# Patient Record
Sex: Male | Born: 1960 | Race: Black or African American | Hispanic: No | Marital: Married | Smoking: Never smoker
Health system: Southern US, Community
[De-identification: ages and names within clinical notes are randomized; demographics above are authoritative.]

## PROBLEM LIST (undated history)

## (undated) DIAGNOSIS — C649 Malignant neoplasm of unspecified kidney, except renal pelvis: Secondary | ICD-10-CM

## (undated) DIAGNOSIS — E119 Type 2 diabetes mellitus without complications: Secondary | ICD-10-CM

## (undated) HISTORY — PX: NEPHRECTOMY: SHX65

---

## 2019-02-17 ENCOUNTER — Other Ambulatory Visit: Payer: Self-pay

## 2019-02-17 ENCOUNTER — Emergency Department (HOSPITAL_BASED_OUTPATIENT_CLINIC_OR_DEPARTMENT_OTHER): Payer: Medicaid Other

## 2019-02-17 ENCOUNTER — Emergency Department (HOSPITAL_BASED_OUTPATIENT_CLINIC_OR_DEPARTMENT_OTHER)
Admission: EM | Admit: 2019-02-17 | Discharge: 2019-02-17 | Disposition: A | Discharge status: Home / Self Care | Payer: Medicaid Other | Attending: Emergency Medicine | Admitting: Emergency Medicine

## 2019-02-17 ENCOUNTER — Encounter (HOSPITAL_BASED_OUTPATIENT_CLINIC_OR_DEPARTMENT_OTHER): Payer: Self-pay

## 2019-02-17 DIAGNOSIS — Z85528 Personal history of other malignant neoplasm of kidney: Secondary | ICD-10-CM | POA: Diagnosis not present

## 2019-02-17 DIAGNOSIS — R109 Unspecified abdominal pain: Secondary | ICD-10-CM

## 2019-02-17 DIAGNOSIS — R110 Nausea: Secondary | ICD-10-CM | POA: Insufficient documentation

## 2019-02-17 DIAGNOSIS — R1011 Right upper quadrant pain: Secondary | ICD-10-CM | POA: Diagnosis not present

## 2019-02-17 DIAGNOSIS — R519 Headache, unspecified: Secondary | ICD-10-CM | POA: Insufficient documentation

## 2019-02-17 DIAGNOSIS — E119 Type 2 diabetes mellitus without complications: Secondary | ICD-10-CM | POA: Insufficient documentation

## 2019-02-17 DIAGNOSIS — R1013 Epigastric pain: Secondary | ICD-10-CM | POA: Insufficient documentation

## 2019-02-17 HISTORY — DX: Type 2 diabetes mellitus without complications: E11.9

## 2019-02-17 HISTORY — DX: Malignant neoplasm of unspecified kidney, except renal pelvis: C64.9

## 2019-02-17 LAB — URINALYSIS, ROUTINE W REFLEX MICROSCOPIC
Bilirubin Urine: NEGATIVE
Glucose, UA: 100 mg/dL — AB
Hgb urine dipstick: NEGATIVE
Ketones, ur: NEGATIVE mg/dL
Leukocytes,Ua: NEGATIVE
Nitrite: NEGATIVE
Protein, ur: NEGATIVE mg/dL
Specific Gravity, Urine: 1.025 (ref 1.005–1.030)
pH: 6 (ref 5.0–8.0)

## 2019-02-17 LAB — CBC WITH DIFFERENTIAL/PLATELET
Abs Immature Granulocytes: 0.01 10*3/uL (ref 0.00–0.07)
Basophils Absolute: 0.1 10*3/uL (ref 0.0–0.1)
Basophils Relative: 1 %
Eosinophils Absolute: 0.6 10*3/uL — ABNORMAL HIGH (ref 0.0–0.5)
Eosinophils Relative: 6 %
HCT: 49.6 % (ref 39.0–52.0)
Hemoglobin: 16 g/dL (ref 13.0–17.0)
Immature Granulocytes: 0 %
Lymphocytes Relative: 27 %
Lymphs Abs: 2.5 10*3/uL (ref 0.7–4.0)
MCH: 28.9 pg (ref 26.0–34.0)
MCHC: 32.3 g/dL (ref 30.0–36.0)
MCV: 89.7 fL (ref 80.0–100.0)
Monocytes Absolute: 1 10*3/uL (ref 0.1–1.0)
Monocytes Relative: 10 %
Neutro Abs: 5.4 10*3/uL (ref 1.7–7.7)
Neutrophils Relative %: 56 %
Platelets: 221 10*3/uL (ref 150–400)
RBC: 5.53 MIL/uL (ref 4.22–5.81)
RDW: 13.7 % (ref 11.5–15.5)
WBC: 9.5 10*3/uL (ref 4.0–10.5)
nRBC: 0 % (ref 0.0–0.2)

## 2019-02-17 LAB — COMPREHENSIVE METABOLIC PANEL
ALT: 48 U/L — ABNORMAL HIGH (ref 0–44)
AST: 36 U/L (ref 15–41)
Albumin: 4.1 g/dL (ref 3.5–5.0)
Alkaline Phosphatase: 53 U/L (ref 38–126)
Anion gap: 11 (ref 5–15)
BUN: 18 mg/dL (ref 6–20)
CO2: 23 mmol/L (ref 22–32)
Calcium: 9.9 mg/dL (ref 8.9–10.3)
Chloride: 103 mmol/L (ref 98–111)
Creatinine, Ser: 1.45 mg/dL — ABNORMAL HIGH (ref 0.61–1.24)
GFR calc Af Amer: >60 mL/min (ref >60)
GFR calc non Af Amer: 53 mL/min — ABNORMAL LOW (ref >60)
Glucose, Bld: 123 mg/dL — ABNORMAL HIGH (ref 70–99)
Potassium: 4.7 mmol/L (ref 3.5–5.1)
Sodium: 137 mmol/L (ref 135–145)
Total Bilirubin: 0.8 mg/dL (ref 0.3–1.2)
Total Protein: 8 g/dL (ref 6.5–8.1)

## 2019-02-17 LAB — LIPASE, BLOOD: Lipase: 34 U/L (ref 11–51)

## 2019-02-17 MED ORDER — PROCHLORPERAZINE EDISYLATE 10 MG/2ML IJ SOLN
10.0000 mg | Freq: Once | INTRAMUSCULAR | Status: AC
  Administered 2019-02-17: 10 mg via INTRAVENOUS
  Filled 2019-02-17: qty 2

## 2019-02-17 MED ORDER — MORPHINE SULFATE (PF) 4 MG/ML IV SOLN
4.0000 mg | Freq: Once | INTRAVENOUS | Status: AC
  Administered 2019-02-17: 4 mg via INTRAVENOUS
  Filled 2019-02-17: qty 1

## 2019-02-17 MED ORDER — SODIUM CHLORIDE 0.9 % IV BOLUS
1000.0000 mL | Freq: Once | INTRAVENOUS | Status: AC
  Administered 2019-02-17: 1000 mL via INTRAVENOUS

## 2019-02-17 MED ORDER — KETOROLAC TROMETHAMINE 15 MG/ML IJ SOLN
15.0000 mg | Freq: Once | INTRAMUSCULAR | Status: AC
  Administered 2019-02-17: 15 mg via INTRAVENOUS
  Filled 2019-02-17: qty 1

## 2019-02-17 MED ORDER — DIPHENHYDRAMINE HCL 50 MG/ML IJ SOLN
12.5000 mg | Freq: Once | INTRAMUSCULAR | Status: AC
  Administered 2019-02-17: 12.5 mg via INTRAVENOUS
  Filled 2019-02-17: qty 1

## 2019-02-17 NOTE — ED Provider Notes (Addendum)
[facility] EMERGENCY DEPARTMENT Provider Note   CSN: [id number] Arrival date & time: 02/17/19  1538     History   Chief Complaint Chief Complaint  Patient presents with  . Mutiple c/o    HPI Leonard Wells is a 57 y.o. male.     HPI   Patient is a 58 year old male with a history of kidney cancer s/p right nephrectomy, diabetes, who presents to the emergency department today for evaluation of multiple complaints including headache, nausea, abd pain.   Headache: HA started gradually yesterday. HA located to the frontal aspect of the head. Pain feels like a throbbing pain that is rated 7/10. Denies exacerbating or alleviating factors. Denies vision changes, speech problems, ataxia, numbness/weakness, lightheadedness. He reports intermittent dizziness that occurs sporadically. Lasts for about 2-3 seconds and resolves on its own. He has taken oxycodone with mild relief.   Abd pain: Abd pain is diffuse and has been intermittent since yesterday. At it its worse he rates the pain 7/10. Describes it as an aching pain. He has never had similar abd pain in the past. Pain is worse when laying on his back or sides. He reports associated nausea, but denies vomiting, diarrhea, constipation. He denies urinary sxs. He is still passing flatus.   Denies rhinorrhea, congestion, sore throat, fever, neck stiffness, neck pain. Denies any chest pain or cough.   He states he called his doctor PTA and who told him to stop taking his medication, Inlyta, as it may be causing his sxs. He states he had chemo 4 days ago. He states that the last two times he had chemo he has felt similar sxs.   Daughter at bedside also voices concern that she states patient had an episode where he zoned out and did not listen to someone who was talking to him.  He later did not recall that this happened.  Another family were also noticed that he was shaking in his sleep at one point yesterday.  Patient notes he has tremors  in his hands when he is awake.  Reviewed records.  Patient had MRI lumbar spine 02/03/2019 which showed, "Lower lumbar spondylosis, slightly progressed from prior MRI 11-Jul-2010. 2. Findings most pronounced at L5-S1 where there is moderate right and mild-to-moderate left foraminal stenosis. 3. Mild-to-moderate bilateral foraminal stenosis at L4-5." He has also had a whole body bone scan on 12/20/18 which did not show any obvious update to suggest mets.   Past Medical History:  Diagnosis Date  . Cancer of kidney ([place])   . Diabetes mellitus without complication ([place])     There are no active problems to display for this patient.  Past Surgical History:  Procedure Laterality Date  . NEPHRECTOMY       Home Medications    Prior to Admission medications   Not on File    Family History No family history on file.  Social History Social History   Tobacco Use  . Smoking status: Never Smoker  . Smokeless tobacco: Never Used  Substance Use Topics  . Alcohol use: Never    Frequency: Never  . Drug use: Never     Allergies   Patient has no known allergies.   Review of Systems Review of Systems  Constitutional: Negative for fever.  HENT: Negative for ear pain and sore throat.   Eyes: Negative for visual disturbance.  Respiratory: Negative for cough and shortness of breath.   Cardiovascular: Negative for chest pain.  Gastrointestinal: Positive for abdominal pain and  nausea. Negative for constipation, diarrhea and vomiting.  Genitourinary: Negative for dysuria and hematuria.  Musculoskeletal: Negative for back pain.  Skin: Negative for rash.  Neurological: Positive for headaches.  All other systems reviewed and are negative.    Physical Exam Updated Vital Signs BP (!) 145/106 (BP Location: Right Arm)   Pulse 81   Temp 98.6 F (37 C) (Oral)   Resp 18   Ht 6\' 2"  (1.88 m)   Wt 123.4 kg   SpO2 98%   BMI 34.92 kg/m   Physical Exam Vitals signs and nursing note  reviewed.  Constitutional:      General: He is not in acute distress.    Appearance: He is well-developed. He is not ill-appearing.  HENT:     Head: Normocephalic and atraumatic.  Eyes:     Conjunctiva/sclera: Conjunctivae normal.  Neck:     Musculoskeletal: Neck supple.  Cardiovascular:     Rate and Rhythm: Normal rate and regular rhythm.     Pulses: Normal pulses.     Heart sounds: Normal heart sounds. No murmur.  Pulmonary:     Effort: Pulmonary effort is normal. No respiratory distress.     Breath sounds: Normal breath sounds. No wheezing, rhonchi or rales.  Abdominal:     General: Bowel sounds are normal.     Palpations: Abdomen is soft.     Tenderness: There is abdominal tenderness (mild epigastric and RUQ TTP). There is right CVA tenderness (mild). There is no left CVA tenderness, guarding or rebound.  Skin:    General: Skin is warm and dry.  Neurological:     Mental Status: He is alert.     ED Treatments / Results  Labs (all labs ordered are listed, but only abnormal results are displayed) Labs Reviewed  CBC WITH DIFFERENTIAL/PLATELET - Abnormal; Notable for the following components:      Result Value   Eosinophils Absolute 0.6 (*)    All other components within normal limits  COMPREHENSIVE METABOLIC PANEL - Abnormal; Notable for the following components:   Glucose, Bld 123 (*)    Creatinine, Ser 1.45 (*)    ALT 48 (*)    GFR calc non Af Amer 53 (*)    All other components within normal limits  URINALYSIS, ROUTINE W REFLEX MICROSCOPIC - Abnormal; Notable for the following components:   Glucose, UA 100 (*)    All other components within normal limits  LIPASE, BLOOD    EKG None  Radiology Ct Head Wo Contrast  Result Date: 02/17/2019 CLINICAL DATA:  Altered mental status EXAM: CT HEAD WITHOUT CONTRAST TECHNIQUE: Contiguous axial images were obtained from the base of the skull through the vertex without intravenous contrast. COMPARISON:  MRI brain 02/10/2018  FINDINGS: Brain: No acute territorial infarction, hemorrhage or intracranial mass. The ventricles are nonenlarged Vascular: No hyperdense vessel or unexpected calcification. Skull: Normal. Negative for fracture or focal lesion. Sinuses/Orbits: No acute finding. Other: None IMPRESSION: Negative non contrasted CT appearance of the brain Electronically Signed   By: Donavan Foil M.D.   On: 02/17/2019 20:05    Procedures Procedures (including critical care time)  Medications Ordered in ED Medications  sodium chloride 0.9 % bolus 1,000 mL (0 mLs Intravenous Stopped 02/17/19 1817)  prochlorperazine (COMPAZINE) injection 10 mg (10 mg Intravenous Given 02/17/19 1752)  diphenhydrAMINE (BENADRYL) injection 12.5 mg (12.5 mg Intravenous Given 02/17/19 1747)  ketorolac (TORADOL) 15 MG/ML injection 15 mg (15 mg Intravenous Given 02/17/19 1749)  morphine 4 MG/ML injection  4 mg (4 mg Intravenous Given 02/17/19 1858)     Initial Impression / Assessment and Plan / ED Course  I have reviewed the triage vital signs and the nursing notes.  Pertinent labs & imaging results that were available during my care of the patient were reviewed by me and considered in my medical decision making (see chart for details).   Final Clinical Impressions(s) / ED Diagnoses   Final diagnoses:  Nonintractable headache, unspecified chronicity pattern, unspecified headache type  Abdominal pain, unspecified abdominal location   Patient is a 58 year old male with a history of kidney cancer s/p right nephrectomy, diabetes, who presents to the emergency department today for headache, nausea and abd pain. He states he has had similar sxs in the past after receiving chemo.   Headache: Frontal HA started gradually yesterday. No associated neuro complaints and neuro exam is normal.   Abd pain: Abd pain is diffuse and has been intermittent since yesterday. On exam has mild epigastric and ruq ttp. No peritoneal signs.  He states he called  his doctor PTA and who told him to stop taking his medication, Inlyta, as it may be causing his sxs.   Pts exam is reassuring. Will obtain labs and give migraine cocktail and reassess.   Reviewed labs and imaging. CBC wnl.  CMP with slight elevation of AST.  Creatinine is at baseline.  His lecture lites are within normal limits.  Lipase is normal.  UA negative for UTI.  I completed a CT scan of his head without any acute findings.  On reassessment he states he feels improved after headache cocktail and pain medication and fluids.  I advised he follow-up with his oncologist in relation to his medication as he may be having symptoms secondary to this.  Advised return to the ED for new or worsening symptoms.  Patient and daughter at bedside voiced understanding and reasons to return.  All questions answered.  Patient is aware discharge.  Case was discussed with Dr. Melina Copa, attending physician who is in agreement with the workup and plan.   ED Discharge Orders    None       Rodney Booze, PA-C 02/17/19 2214    Rodney Booze, PA-C 02/17/19 2214    Hayden Rasmussen, MD 02/17/19 2215

## 2019-02-17 NOTE — ED Triage Notes (Signed)
Pt entered triage with slow gait-when asked what he was here for states "everything"-c/o back pain, abd pain, nausea, HA-sx started yesterday-NAD

## 2019-02-17 NOTE — ED Notes (Signed)
Call placed to daughter per patients request.  She went home, will come back to visit with patient.

## 2019-02-17 NOTE — Discharge Instructions (Signed)
Please call your oncologist tomorrow to schedule an appointment for follow-up and return to the emergency department for any new or worsening symptoms.

## 2019-02-23 ENCOUNTER — Encounter (HOSPITAL_BASED_OUTPATIENT_CLINIC_OR_DEPARTMENT_OTHER): Payer: Self-pay | Admitting: *Deleted

## 2019-02-23 ENCOUNTER — Emergency Department (HOSPITAL_BASED_OUTPATIENT_CLINIC_OR_DEPARTMENT_OTHER): Payer: Medicaid Other

## 2019-02-23 ENCOUNTER — Other Ambulatory Visit: Payer: Self-pay

## 2019-02-23 ENCOUNTER — Emergency Department (HOSPITAL_BASED_OUTPATIENT_CLINIC_OR_DEPARTMENT_OTHER)
Admission: EM | Admit: 2019-02-23 | Discharge: 2019-02-23 | Disposition: A | Discharge status: Home / Self Care | Payer: Medicaid Other | Attending: Emergency Medicine | Admitting: Emergency Medicine

## 2019-02-23 DIAGNOSIS — R112 Nausea with vomiting, unspecified: Secondary | ICD-10-CM | POA: Insufficient documentation

## 2019-02-23 DIAGNOSIS — E119 Type 2 diabetes mellitus without complications: Secondary | ICD-10-CM | POA: Diagnosis not present

## 2019-02-23 DIAGNOSIS — E860 Dehydration: Secondary | ICD-10-CM | POA: Insufficient documentation

## 2019-02-23 DIAGNOSIS — R1084 Generalized abdominal pain: Secondary | ICD-10-CM | POA: Insufficient documentation

## 2019-02-23 DIAGNOSIS — R109 Unspecified abdominal pain: Secondary | ICD-10-CM | POA: Diagnosis present

## 2019-02-23 LAB — URINALYSIS, ROUTINE W REFLEX MICROSCOPIC
Bilirubin Urine: NEGATIVE
Glucose, UA: NEGATIVE mg/dL
Hgb urine dipstick: NEGATIVE
Ketones, ur: NEGATIVE mg/dL
Leukocytes,Ua: NEGATIVE
Nitrite: NEGATIVE
Protein, ur: NEGATIVE mg/dL
Specific Gravity, Urine: 1.02 (ref 1.005–1.030)
pH: 6.5 (ref 5.0–8.0)

## 2019-02-23 LAB — CBC
HCT: 45.1 % (ref 39.0–52.0)
Hemoglobin: 14.6 g/dL (ref 13.0–17.0)
MCH: 29.3 pg (ref 26.0–34.0)
MCHC: 32.4 g/dL (ref 30.0–36.0)
MCV: 90.4 fL (ref 80.0–100.0)
Platelets: 237 10*3/uL (ref 150–400)
RBC: 4.99 MIL/uL (ref 4.22–5.81)
RDW: 14.2 % (ref 11.5–15.5)
WBC: 9.6 10*3/uL (ref 4.0–10.5)
nRBC: 0 % (ref 0.0–0.2)

## 2019-02-23 LAB — COMPREHENSIVE METABOLIC PANEL
ALT: 78 U/L — ABNORMAL HIGH (ref 0–44)
AST: 111 U/L — ABNORMAL HIGH (ref 15–41)
Albumin: 3.6 g/dL (ref 3.5–5.0)
Alkaline Phosphatase: 91 U/L (ref 38–126)
Anion gap: 10 (ref 5–15)
BUN: 21 mg/dL — ABNORMAL HIGH (ref 6–20)
CO2: 24 mmol/L (ref 22–32)
Calcium: 9.5 mg/dL (ref 8.9–10.3)
Chloride: 105 mmol/L (ref 98–111)
Creatinine, Ser: 1.58 mg/dL — ABNORMAL HIGH (ref 0.61–1.24)
GFR calc Af Amer: 55 mL/min — ABNORMAL LOW (ref >60)
GFR calc non Af Amer: 48 mL/min — ABNORMAL LOW (ref >60)
Glucose, Bld: 168 mg/dL — ABNORMAL HIGH (ref 70–99)
Potassium: 3.7 mmol/L (ref 3.5–5.1)
Sodium: 139 mmol/L (ref 135–145)
Total Bilirubin: 0.9 mg/dL (ref 0.3–1.2)
Total Protein: 7.4 g/dL (ref 6.5–8.1)

## 2019-02-23 LAB — LIPASE, BLOOD: Lipase: 94 U/L — ABNORMAL HIGH (ref 11–51)

## 2019-02-23 MED ORDER — ONDANSETRON HCL 4 MG/2ML IJ SOLN
4.0000 mg | Freq: Once | INTRAMUSCULAR | Status: AC
  Administered 2019-02-23: 05:00:00 4 mg via INTRAVENOUS
  Filled 2019-02-23: qty 2

## 2019-02-23 MED ORDER — SODIUM CHLORIDE 0.9% FLUSH
3.0000 mL | Freq: Once | INTRAVENOUS | Status: AC
  Administered 2019-02-23: 04:00:00 3 mL via INTRAVENOUS
  Filled 2019-02-23: qty 3

## 2019-02-23 MED ORDER — ONDANSETRON HCL 4 MG/2ML IJ SOLN
INTRAMUSCULAR | Status: AC
  Administered 2019-02-23: 04:00:00 4 mg via INTRAVENOUS
  Filled 2019-02-23: qty 2

## 2019-02-23 MED ORDER — IOHEXOL 300 MG/ML  SOLN
80.0000 mL | Freq: Once | INTRAMUSCULAR | Status: AC | PRN
  Administered 2019-02-23: 05:00:00 80 mL via INTRAVENOUS

## 2019-02-23 MED ORDER — SODIUM CHLORIDE 0.9 % IV BOLUS (SEPSIS)
1000.0000 mL | Freq: Once | INTRAVENOUS | Status: AC
  Administered 2019-02-23: 05:00:00 1000 mL via INTRAVENOUS

## 2019-02-23 MED ORDER — SODIUM CHLORIDE 0.9 % IV BOLUS (SEPSIS)
1000.0000 mL | Freq: Once | INTRAVENOUS | Status: AC
  Administered 2019-02-23: 04:00:00 1000 mL via INTRAVENOUS

## 2019-02-23 MED ORDER — FENTANYL CITRATE (PF) 100 MCG/2ML IJ SOLN
100.0000 ug | Freq: Once | INTRAMUSCULAR | Status: AC
  Administered 2019-02-23: 04:00:00 100 ug via INTRAVENOUS
  Filled 2019-02-23: qty 2

## 2019-02-23 MED ORDER — FENTANYL CITRATE (PF) 100 MCG/2ML IJ SOLN
100.0000 ug | Freq: Once | INTRAMUSCULAR | Status: AC
  Administered 2019-02-23: 06:00:00 100 ug via INTRAVENOUS
  Filled 2019-02-23: qty 2

## 2019-02-23 MED ORDER — ONDANSETRON HCL 4 MG/2ML IJ SOLN
4.0000 mg | Freq: Once | INTRAMUSCULAR | Status: AC | PRN
  Administered 2019-02-23: 04:00:00 4 mg via INTRAVENOUS

## 2019-02-23 NOTE — ED Notes (Signed)
Patient transported to CT 

## 2019-02-23 NOTE — ED Triage Notes (Signed)
Pt arrives via [facility] from home with c/o right sided abdominal pain. Pt has hx of kidney cancer. 148/96, hr78, r18, cbg 118, temp 97.9  Pt reporting onset of abdominal pain that awoke him from sleep. Pt actively vomiting during triage (500cc clear emesis). No fevers, diarrhea or constipation. No meds PTA.

## 2019-02-23 NOTE — ED Notes (Signed)
ED Provider at bedside. 

## 2019-02-23 NOTE — ED Provider Notes (Signed)
[facility] EMERGENCY DEPARTMENT Provider Note   CSN: [id number] Arrival date & time: 02/23/19  0330     History   Chief Complaint Chief Complaint  Patient presents with  . Abdominal Pain    HPI Leonard Wells is a 58 y.o. male.     The history is provided by the patient.  Abdominal Pain Pain location:  Generalized Pain quality: aching   Pain radiates to:  Does not radiate Pain severity:  Severe Onset quality:  Sudden Timing:  Constant Progression:  Worsening Chronicity:  Recurrent Relieved by:  Nothing Worsened by:  Movement and palpation Associated symptoms: nausea and vomiting   Associated symptoms: no chest pain, no constipation, no cough, no diarrhea, no dysuria, no fever, no hematemesis, no hematochezia and no shortness of breath   Risk factors: multiple surgeries   Patient with history of diabetes, renal carcinoma s/p right nephrectomy on chemotherapy presents with abdominal pain and vomiting.  He reports he woke up with the symptoms.  He went to bed feeling well.  No fevers.  No chest pain/shortness of breath/cough.  No recent change in his bowel habits.  He does report he is having symptoms previously, but this came on abruptly tonight.  Past Medical History:  Diagnosis Date  . Cancer of kidney ([place])   . Diabetes mellitus without complication ([place])     There are no active problems to display for this patient.   Past Surgical History:  Procedure Laterality Date  . NEPHRECTOMY          Home Medications    Prior to Admission medications   Not on File    Family History No family history on file.  Social History Social History   Tobacco Use  . Smoking status: Never Smoker  . Smokeless tobacco: Never Used  Substance Use Topics  . Alcohol use: Never    Frequency: Never  . Drug use: Never     Allergies   Patient has no known allergies.   Review of Systems Review of Systems  Constitutional: Negative for fever.  Respiratory:  Negative for cough and shortness of breath.   Cardiovascular: Negative for chest pain.  Gastrointestinal: Positive for abdominal pain, nausea and vomiting. Negative for constipation, diarrhea, hematemesis and hematochezia.  Genitourinary: Negative for dysuria.  All other systems reviewed and are negative.    Physical Exam Updated Vital Signs BP (!) 146/96   Pulse 83   Temp 97.9 F (36.6 C) (Oral)   Resp 18   SpO2 100%   Physical Exam CONSTITUTIONAL: Well developed/well nourished, uncomfortable. HEAD: Normocephalic/atraumatic EYES: EOMI/PERRL, no icterus ENMT: Mucous membranes dry NECK: supple no meningeal signs SPINE/BACK:entire spine nontender CV: S1/S2 noted, no murmurs/rubs/gallops noted LUNGS: Lungs are clear to auscultation bilaterally, no apparent distress ABDOMEN: soft, moderate diffuse tenderness, no rebound or guarding, decreased bowel sounds throughout abdomen, multiple surgical scars noted GU:no cva tenderness NEURO: Pt is awake/alert/appropriate, moves all extremitiesx4.  No facial droop.   EXTREMITIES: pulses normal/equal, full ROM SKIN: warm, color normal PSYCH: no abnormalities of mood noted, alert and oriented to situation   ED Treatments / Results  Labs (all labs ordered are listed, but only abnormal results are displayed) Labs Reviewed  LIPASE, BLOOD - Abnormal; Notable for the following components:      Result Value   Lipase 94 (*)    All other components within normal limits  COMPREHENSIVE METABOLIC PANEL - Abnormal; Notable for the following components:   Glucose, Bld 168 (*)  BUN 21 (*)    Creatinine, Ser 1.58 (*)    AST 111 (*)    ALT 78 (*)    GFR calc non Af Amer 48 (*)    GFR calc Af Amer 55 (*)    All other components within normal limits  CBC  URINALYSIS, ROUTINE W REFLEX MICROSCOPIC    EKG EKG Interpretation  Date/Time:  Wednesday February 23 2019 03:36:39 EDT Ventricular Rate:  86 PR Interval:    QRS Duration: 96 QT  Interval:  376 QTC Calculation: 450 R Axis:   -37 Text Interpretation:  Sinus rhythm Left axis deviation No previous ECGs available Confirmed by Ripley Fraise [phone]) on 02/23/2019 3:56:50 AM   Radiology Ct Abdomen Pelvis W Contrast  Result Date: 02/23/2019 CLINICAL DATA:  Acute generalized abdominal pain. Right flank pain for 1 hour EXAM: CT ABDOMEN AND PELVIS WITH CONTRAST TECHNIQUE: Multidetector CT imaging of the abdomen and pelvis was performed using the standard protocol following bolus administration of intravenous contrast. CONTRAST:  16mL OMNIPAQUE IOHEXOL 300 MG/ML  SOLN COMPARISON:  01/21/2019 FINDINGS: Lower chest: Known pulmonary nodules in this patient with history of metastatic renal cell carcinoma. The largest is in the subpleural right middle lobe at 9 mm. Mild dependent atelectasis. Hepatobiliary: Hepatic steatosis.No evidence of biliary obstruction or stone. Pancreas: Unremarkable. Spleen: Unremarkable. Adrenals/Urinary Tract: Negative adrenals. Right nephrectomy with no nodularity in the surgical bed. Negative left kidney. Unremarkable bladder. Stomach/Bowel:  No obstruction. No appendicitis. Vascular/Lymphatic: No acute vascular abnormality. No mass or adenopathy. Reproductive:Subcentimeter nodular focus right of the prostate is likely a vein, stable from prior. Other: No ascites or pneumoperitoneum. Musculoskeletal: No acute abnormalities.  Lumbar spine degeneration. IMPRESSION: 1. No acute finding, including appendicitis. 2. Right nephrectomy for renal cell carcinoma. Known pulmonary nodules from staging scan last month. 3. Hepatic steatosis. Electronically Signed   By: Monte Fantasia M.D.   On: 02/23/2019 05:34    Procedures Procedures   Medications Ordered in ED Medications  sodium chloride flush (NS) 0.9 % injection 3 mL (3 mLs Intravenous Given 02/23/19 0355)  ondansetron (ZOFRAN) injection 4 mg (4 mg Intravenous Given 02/23/19 0355)  fentaNYL (SUBLIMAZE) injection  100 mcg (100 mcg Intravenous Given 02/23/19 0355)  sodium chloride 0.9 % bolus 1,000 mL (0 mLs Intravenous Stopped 02/23/19 0453)  sodium chloride 0.9 % bolus 1,000 mL (1,000 mLs Intravenous New Bag/Given 02/23/19 0454)  ondansetron (ZOFRAN) injection 4 mg (4 mg Intravenous Given 02/23/19 0454)  iohexol (OMNIPAQUE) 300 MG/ML solution 80 mL (80 mLs Intravenous Contrast Given 02/23/19 0511)  fentaNYL (SUBLIMAZE) injection 100 mcg (100 mcg Intravenous Given 02/23/19 0549)     Initial Impression / Assessment and Plan / ED Course  I have reviewed the triage vital signs and the nursing notes.  Pertinent labs  results that were available during my care of the patient were reviewed by me and considered in my medical decision making (see chart for details).        4:13 AM Patient presents with abrupt onset abdominal pain and vomiting.  He reports heavy symptoms before.  Per his oncology notes, his medications that include Inlyta can cause significant GI distress that may be causing his symptoms Plan to give IV fluids/pain medicine and reassess after labs. 5:02 AM Patient does have worsening lab abnormalities.  Elevated lipase which could indicate pancreatitis.  The chemotherapy drugs the patient is on can cause  significant GI symptoms.  He recently started holding his Inlyta per his oncologist recommendations due to significant  symptoms.  He continues to have focal abdominal tenderness.  He also reports continued nausea.  Will proceed with CT imaging to evaluate for any acute abdominal emergency. 6:24 AM CT scan is negative for acute process and unchanged from prior. Patient overall feels improved.  He is taking p.o. fluids. He strongly suspect this is due to his chemotherapy.  He reports he actually tried to eat a large meal last night and that may have triggered his pain and vomiting. He is to follow-up with his oncologist soon about his chemotherapy reactions. At this point there is no acute  signs of abdominal emergency. He already has pain medicines and nausea medicine at home.  Patient feels comfortable discharge home Final Clinical Impressions(s) / ED Diagnoses   Final diagnoses:  Generalized abdominal pain  Dehydration  Non-intractable vomiting with nausea, unspecified vomiting type    ED Discharge Orders    None       Ripley Fraise, MD 02/23/19 [phone]

## 2019-02-23 NOTE — Discharge Instructions (Addendum)
°  SEEK IMMEDIATE MEDICAL ATTENTION IF: °The pain does not go away or becomes severe, particularly over the next 8-12 hours.  °A temperature above 100.4F develops.  °Repeated vomiting occurs (multiple episodes).  ° °Blood is being passed in stools or vomit (bright red or black tarry stools).  °Return also if you develop chest pain, difficulty breathing, dizziness or fainting, or become confused, poorly responsive, or inconsolable. ° °

## 2020-02-16 IMAGING — CT CT HEAD W/O CM
3 series · 15 of 47 positions shown, 18 images · non-contrast
Comparison: MRI brain 02/10/2018

CLINICAL DATA: Altered mental status

EXAM:
CT HEAD WITHOUT CONTRAST
TECHNIQUE: Contiguous axial images were obtained from the base of the skull
through the vertex without intravenous contrast.

[Series 2: head wo · axial · 0.48mm/px · z∈[+1270,+1394]mm · 9 of 31 slices shown, 12 images]
[im 3/31  brain]
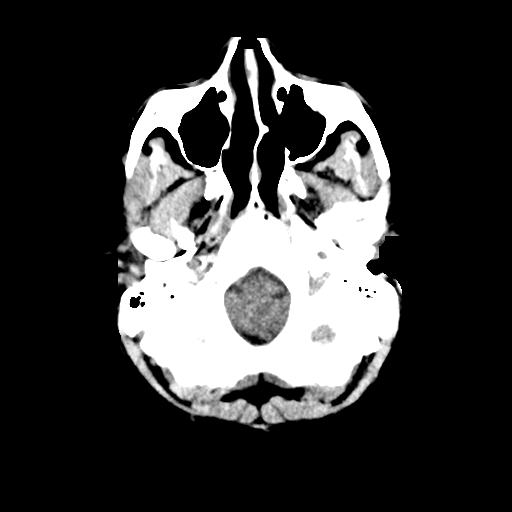
[im 3/31  bone]
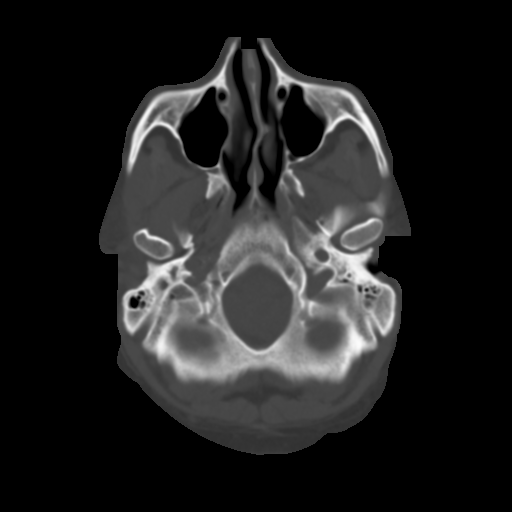
[im 6/31  brain]
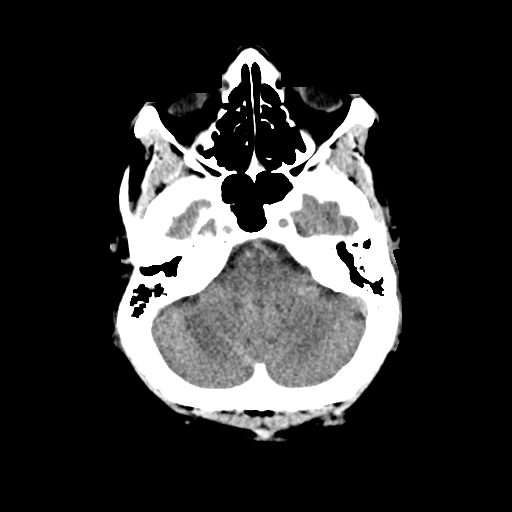
[im 9/31  brain]
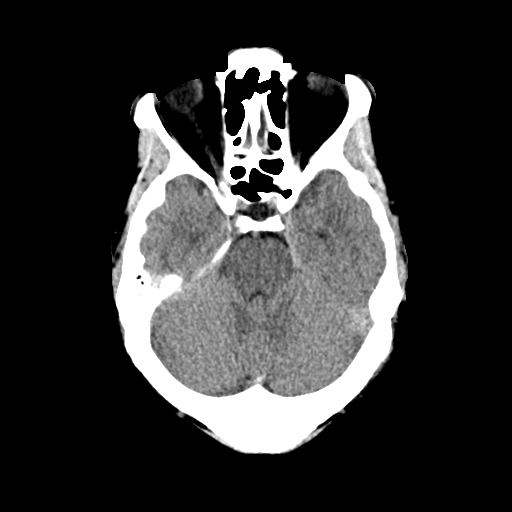
[im 12/31  brain]
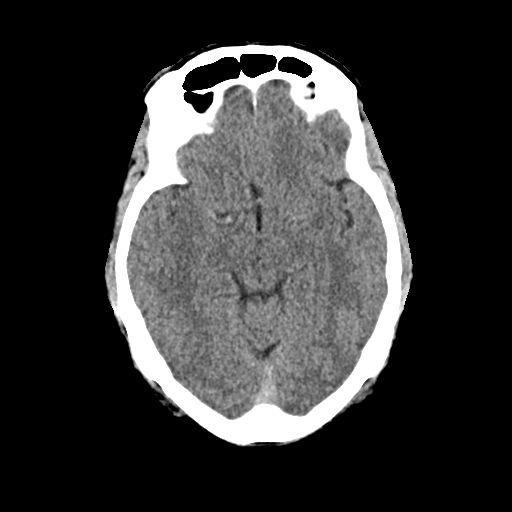
[im 16/31  brain]
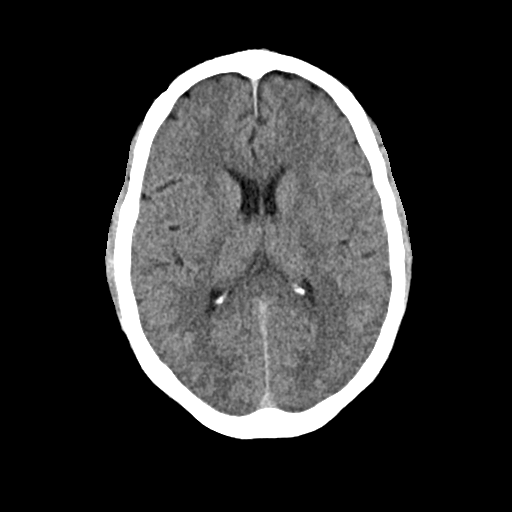
[im 16/31  bone]
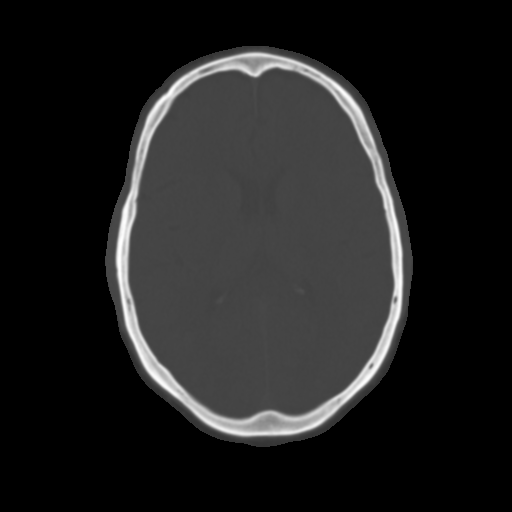
[im 19/31  brain]
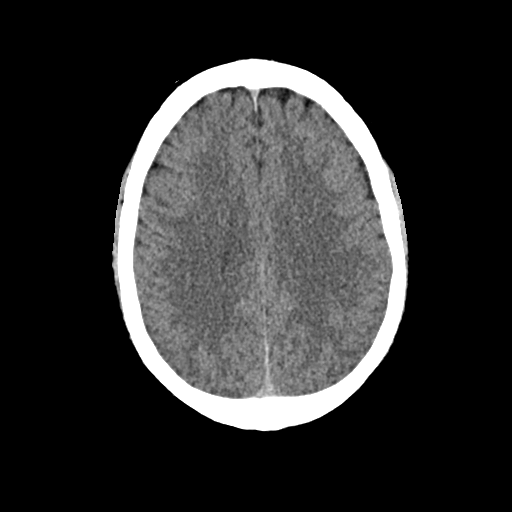
[im 22/31  brain]
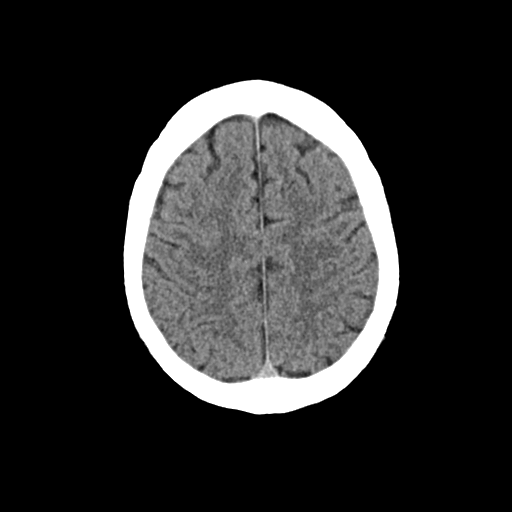
[im 25/31  brain]
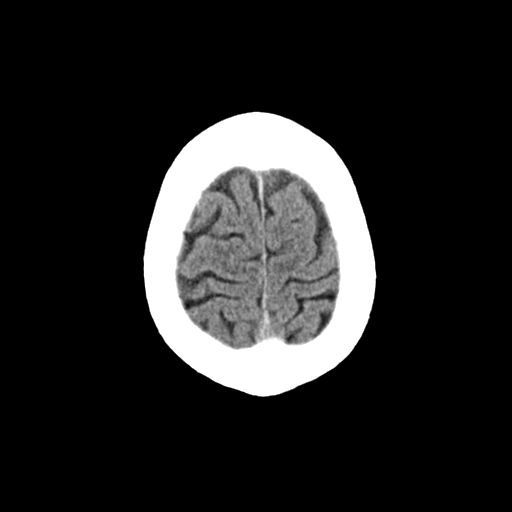
[im 28/31  brain]
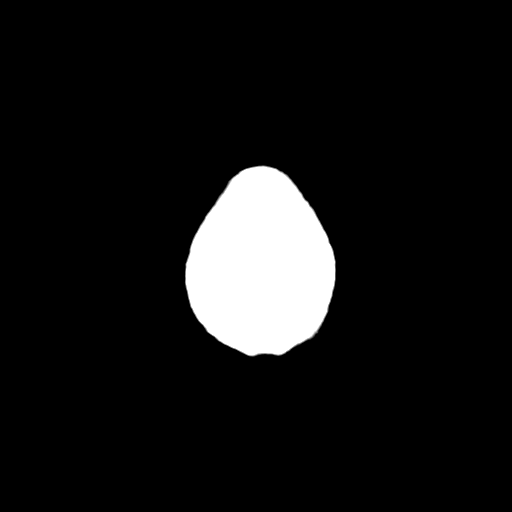
[im 28/31  bone]
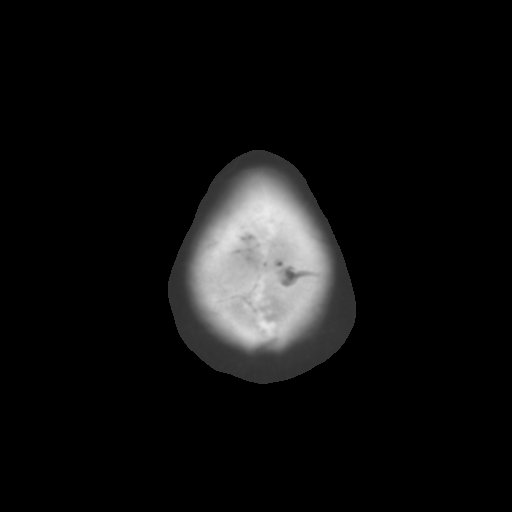

[Series 4: coronal soft · coronal · 0.33mm/px · 3 of 74 slices shown]
[im 25/74  brain]
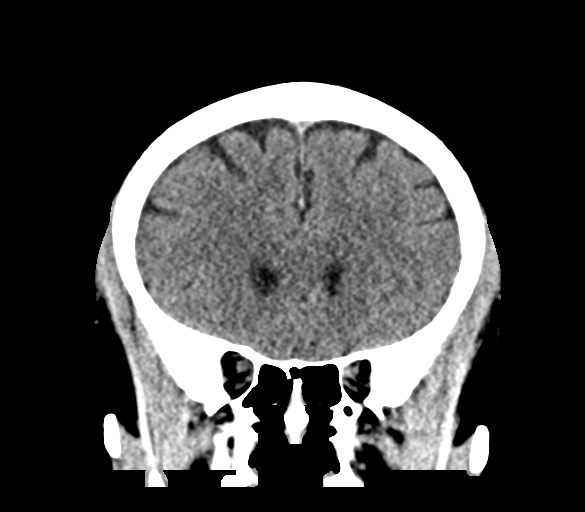
[im 33/74  brain]
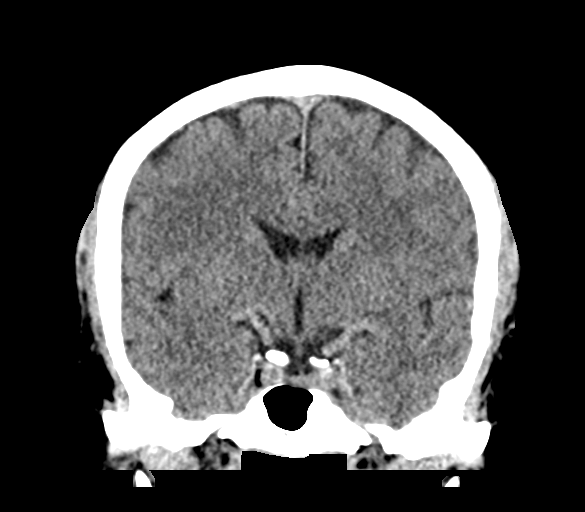
[im 41/74  brain]
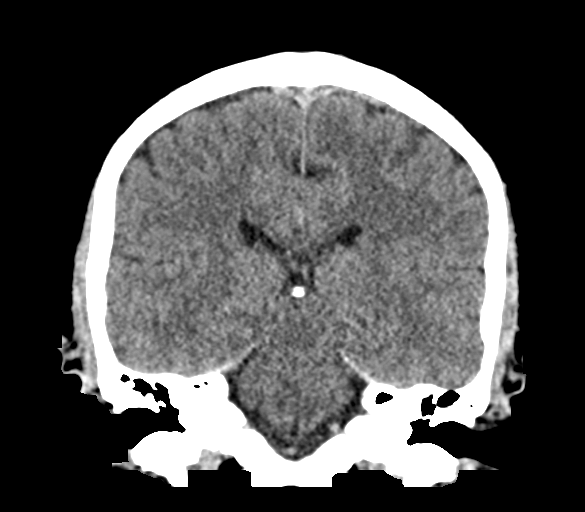

[Series 5: sag soft · sagittal · 0.30mm/px · 3 of 62 slices shown]
[im 21/62  brain]
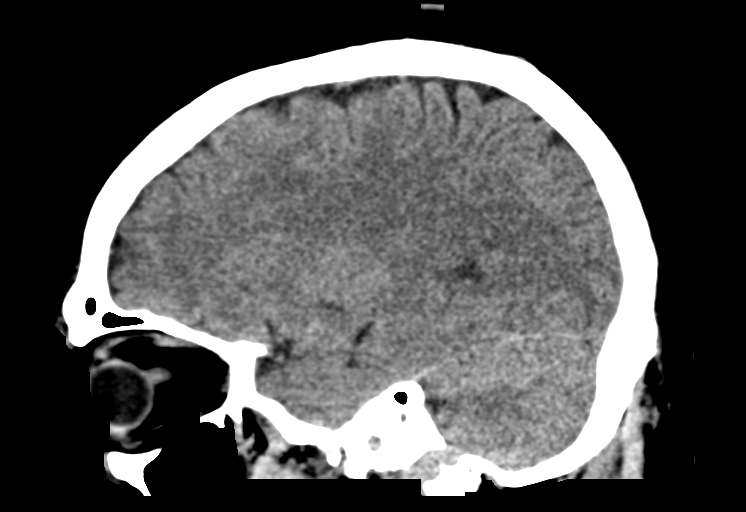
[im 31/62  brain]
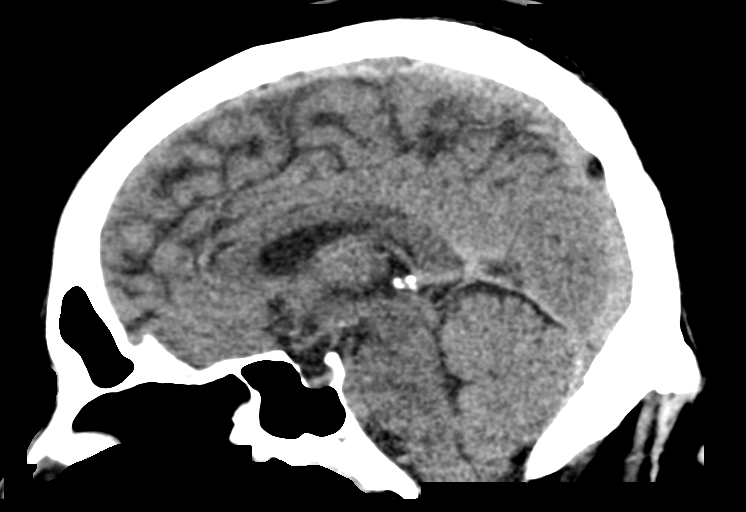
[im 41/62  brain]
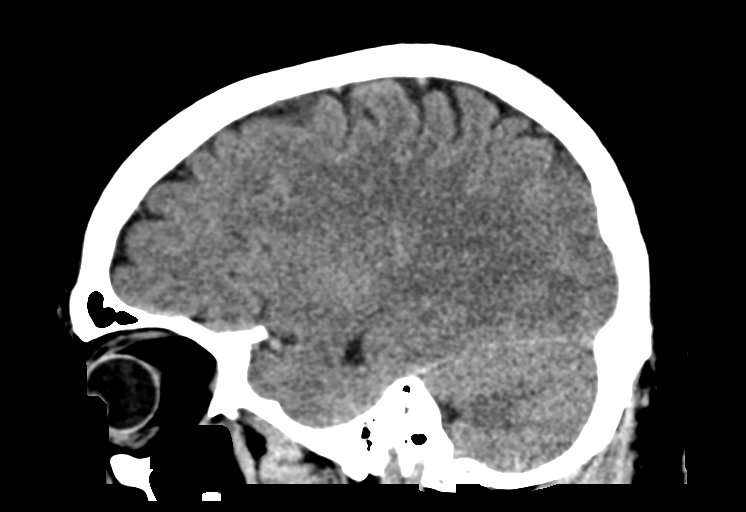

[15 of 47 positions shown; findings below may reference images not displayed]

FINDINGS: Brain: No acute territorial infarction, hemorrhage or intracranial
mass. The ventricles are nonenlarged

Vascular: No hyperdense vessel or unexpected calcification.

Skull: Normal. Negative for fracture or focal lesion.

Sinuses/Orbits: No acute finding.

Other: None
IMPRESSION: Negative non contrasted CT appearance of the brain
